# Patient Record
Sex: Male | Born: 1965 | Race: White | Hispanic: No | Marital: Married | Smoking: Never smoker
Health system: Southern US, Community
[De-identification: ages and names within clinical notes are randomized; demographics above are authoritative.]

## PROBLEM LIST (undated history)

## (undated) DIAGNOSIS — N200 Calculus of kidney: Secondary | ICD-10-CM

## (undated) DIAGNOSIS — G4730 Sleep apnea, unspecified: Secondary | ICD-10-CM

## (undated) DIAGNOSIS — K219 Gastro-esophageal reflux disease without esophagitis: Secondary | ICD-10-CM

## (undated) DIAGNOSIS — I10 Essential (primary) hypertension: Secondary | ICD-10-CM

## (undated) HISTORY — DX: Gastro-esophageal reflux disease without esophagitis: K21.9

## (undated) HISTORY — PX: ROTATOR CUFF REPAIR: SHX139

## (undated) HISTORY — DX: Calculus of kidney: N20.0

## (undated) HISTORY — DX: Essential (primary) hypertension: I10

## (undated) HISTORY — PX: OTHER SURGICAL HISTORY: SHX169

## (undated) HISTORY — DX: Sleep apnea, unspecified: G47.30

---

## 2001-09-17 ENCOUNTER — Emergency Department (HOSPITAL_COMMUNITY): Admission: EM | Admit: 2001-09-17 | Discharge: 2001-09-17 | Payer: Self-pay

## 2007-07-25 ENCOUNTER — Ambulatory Visit: Payer: Self-pay | Admitting: Cardiology

## 2007-07-27 ENCOUNTER — Ambulatory Visit: Payer: Self-pay | Admitting: Cardiology

## 2007-07-27 ENCOUNTER — Observation Stay (HOSPITAL_COMMUNITY): Admission: EM | Admit: 2007-07-27 | Discharge: 2007-07-28 | Payer: Self-pay | Admitting: General Surgery

## 2008-05-14 IMAGING — CR DG CHEST 1V PORT
1 series · 1 of 1 positions shown · non-contrast
Comparison: None.

CLINICAL DATA: Chest pain.  
 PORTABLE CHEST ? 1 VIEW ? 07/27/07:

[view not recorded]
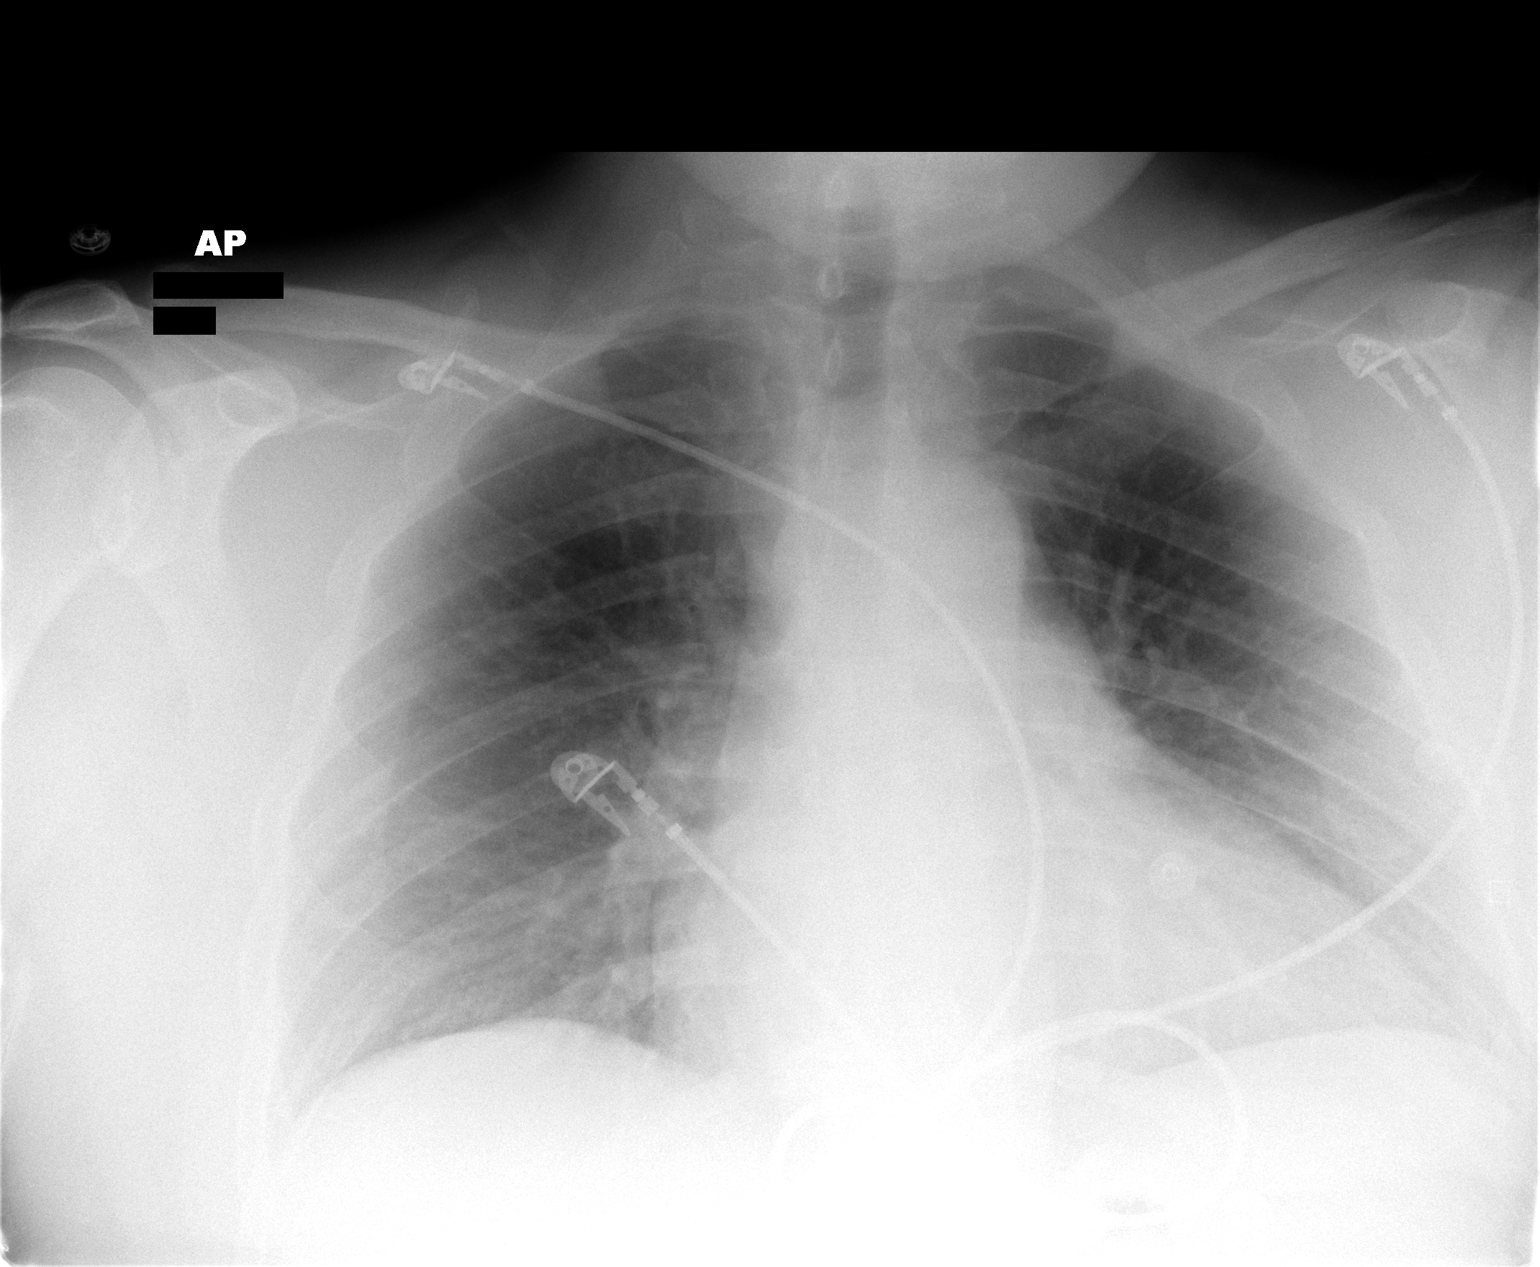

[1 of 1 positions shown; findings below may reference images not displayed]

FINDINGS: Trachea is midline.  Heart size is at the upper limits of normal and accentuated by technique.  Lungs are clear.
IMPRESSION: No acute findings.

## 2011-03-03 NOTE — Cardiovascular Report (Signed)
NAMEKAYMON, DENOMME                 ACCOUNT NO.:  [id number]   MEDICAL RECORD NO.:  [id number]          PATIENT TYPE:  OBV   LOCATION:  4743                         FACILITY:  [facility]   PHYSICIAN:  Veverly Fells. Excell Seltzer, MD  DATE OF BIRTH:  12/15/1965   DATE OF PROCEDURE:  07/28/2007  DATE OF DISCHARGE:                            CARDIAC CATHETERIZATION   PROCEDURE:  Left heart catheterization, selective coronary angiography,  left ventricular angiography, and StarClose of the right femoral artery.   INDICATIONS:  Mr. Carbajal is a 45 year old gentleman with multiple cardiac  risk factors including hypertension and obesity.  He presented with 1  week of chest pain and was referred for cardiac catheterization for  definitive evaluation.   Risks and indications of the procedure were explained to the patient.  Informed consent was obtained.  The right groin was prepped, draped,  anesthetized with 1% lidocaine using the modified Seldinger technique.  A 6 French sheath was placed in the right femoral artery.  Standard 6  French Judkins catheters were used to image the coronary arteries.  Following selective coronary angiography, an angled pigtail catheter was  placed in the left ventricle where pressures were recorded.  Left  ventriculogram was performed.  A pullback across the aortic valve was  done.  At the completion of procedure, a StarClose device was used to  seal the femoral arteriotomy.   FINDINGS:  Aortic pressure 124/84 with mean of 104, left ventricular  pressure 120/13.   The left mainstem is angiographically normal.  It bifurcates into the  LAD and left circumflex.   The LAD is a large-caliber vessel that courses down and reaches the LV  apex.  It supplies 2 medium-sized diagonal branches in the territory of  the second and third diagonal.  The proximal mid and distal aspects of  the LAD have no significant angiographic stenosis.   The left circumflex is a large-caliber vessel.   There is a smaller  medium branch present.  The circumflex supplies a large first OM that is  angiographically normal.  Beyond the first OM, the AV groove circumflex  courses down and supplies 2 small posterolateral branches.  There is no  significant angiographic stenosis in the left circumflex system.   The right coronary artery is of large caliber.  It is a dominant vessel.  It supplies a right PDA and a small right posterolateral branch.  There  are two RV marginal branches that arise from the mid aspect of the right  coronary artery.  There is no significant angiographic stenosis  throughout the right coronary artery.   Left ventricular function assessed by 30 degree RAO ventriculography is  normal.  LVEF is estimated at 65%.  There is no mitral regurgitation.   ASSESSMENT:  1. Normal coronary arteries.  2. Normal left ventricular systolic function.  3. Normal left ventricular diastolic filling pressure.   DISCUSSION:  Suspect noncardiac chest pain.  Mr. Maberry symptoms have  now resolved.  Would recommend ongoing primary risk reduction efforts  and likely discharge home later today.      Veverly Fells.  Excell Seltzer, MD  Electronically Signed     MDC/MEDQ  D:  07/28/2007  T:  07/28/2007  Job:  [id number]

## 2011-03-03 NOTE — Discharge Summary (Signed)
NAMEMORSE, BRUEGGEMANN                 ACCOUNT NO.:  [id number]   MEDICAL RECORD NO.:  [id number]          PATIENT TYPE:  OBV   LOCATION:  2855                         FACILITY:  [facility]   PHYSICIAN:  Veverly Fells. Excell Seltzer, MD  DATE OF BIRTH:  1966/03/26   DATE OF ADMISSION:  07/27/2007  DATE OF DISCHARGE:  07/28/2007                               DISCHARGE SUMMARY   PRIMARY CARDIOLOGIST:  Maisie Fus C. Wall, MD, [facility].   PRIMARY CARE PHYSICIAN:  Evelena Peat, M.D.   DISCHARGE DIAGNOSIS:  Chest pain.   SECONDARY DIAGNOSES:  1. Hypertension.  2. Obesity.  3. History of right wrist surgery.   ALLERGIES:  NO KNOWN DRUG ALLERGIES.   PROCEDURE:  Left heart cardiac catheterization.   HISTORY OF PRESENT ILLNESS:  A 45 year old Caucasian male with a five-  week history of intermittent chest pressure, worst with inhalation and  improved with BC powders.  He had recurrent symptoms on Saturday,  July 23, 2007, presented to the [facility] ER where he ruled out and  underwent exercise treadmill testing on Monday, July 25, 2007, which  was reportedly normal.  He returned to work and noted weakness and  orthostatic dizziness as well as recurrent chest pressure and  lightheadedness on July 27, 2007.  He therefore presented to the  [facility] ED where his ECG showed no acute changes  and cardiac markers were negative.  He was admitted for evaluation with  cardiac catheterization.   HOSPITAL COURSE:  Patient ruled out for MI and underwent left heart  cardiac catheterization this morning, July 28, 2007, revealing normal  coronary arteries with an EF of 65%.  Presumably his chest pain is  noncardiac and he is being discharged home this afternoon in  satisfactory condition.   DISCHARGE LABORATORY DATA:  Hemoglobin 13.6, hematocrit 39.7, wbc 9,  platelets 237, MCV 83.9.  Sodium 137, potassium 3.5, chloride 104, CO2  26, BUN 17, creatinine 1.09, glucose 123.  INR 1.1.  CK  139, MB 2.6,  troponin less than 0.01. Calcium 8.9.   DISPOSITION:  Patient is being discharged home today in good condition.   FOLLOW UP PLANS AND APPOINTMENTS:  He is asked to follow up with his  primary care Sharryn Belding, Dr. Caryl Never, in [place], [place], in  one to two weeks.   </DISCHARGE MEDICATIONS>  Micardis 40 mg daily.   OUTSTANDING LABORATORY STUDIES:  None.   DURATION OF DISCHARGE ENCOUNTER:  33 minutes including physician time.      Nicolasa Ducking, ANP      Veverly Fells. Excell Seltzer, MD  Electronically Signed    CB/MEDQ  D:  07/28/2007  T:  07/28/2007  Job:  [id number]   cc:   Evelena Peat, M.D.

## 2011-03-03 NOTE — H&P (Signed)
NAMEARRIS, MEYN                 ACCOUNT NO.:  [id number]   MEDICAL RECORD NO.:  [id number]          PATIENT TYPE:  OBV   LOCATION:  2855                         FACILITY:  [facility]   PHYSICIAN:  Maisie Fus C. Wall, MD, FACCDATE OF BIRTH:  Aug 23, 1966   DATE OF ADMISSION:  07/27/2007  DATE OF DISCHARGE:                              HISTORY & PHYSICAL   PRIMARY CARE PHYSICIAN:  Dr. Caryl Never at [facility].   PRIMARY CARDIOLOGIST:  Will be Dr. Juanito Doom.   CHIEF COMPLAINT:  Chest pain.   HISTORY OF PRESENT ILLNESS:  Mr. Vogel is a 45 year old male with no  previous history of coronary artery disease.  Five days ago, he  developed a sharp pain in the right side of his neck that went to the  posterior part of his neck and also was associated with headache.  At  the same time, he developed chest pressure at a 6/10.  Inhaling deeply  makes it better, but when he exhales, he feels it just as before.  He  took [facility] Powders with some relief but the pain continued intermittently.  He went to [facility] Emergency Room and was admitted.  His cardiac  enzymes were negative for MI and he was discharged Sunday, coming back  Monday for an exercise treadmill test.  He had no acute ischemic changes  on his EKG but did have some chest pressure with exertion.  Monday and  Tuesday he felt weak, and then today he was at work when he developed  orthostatic dizziness and then chest pain that he describes as pressure  as well as a sharp shooting pain which reached a 6/10.  This was  associated with numbness in his left arm and leg.  He came to the  emergency room.  He feels slightly light-headed now and is complaining  of the chest pressure at a 4 or 5/10.  The other sharp pain is gone.  The only medication he has received is baby aspirin.  He was given 1  sublingual nitroglycerin in the emergency room and his symptoms were  almost entirely relieved.  He denies shortness of breath, nausea,  vomiting or diaphoresis.   PAST MEDICAL HISTORY:  1. He was diagnosed with hypertension three days ago and took his      first dose of blood pressure medication today.  2. He is obese.  3. He had an exercise treadmill test at [facility] during which he      exercised for 9 minutes and reached 87% of his predictive maximum      heart rate.  The study was ended because of fatigue as well as      throat tightness and chest tightness, no significant EKG changes.   He denies other past medical history but has had surgery twice on his  right wrist.   ALLERGIES:  No known drug allergies.   CURRENT MEDICATIONS:  Micardis 40 mg per day (new).   SOCIAL HISTORY:  He lives in [place] with his wife and works as a  Armed forces training and education officer.  He has no history of  alcohol, tobacco or drug  abuse.   FAMILY HISTORY:  Both of his parents are alive and in their early to mid  21s and neither one has a history of coronary artery disease.  He has  two sisters, none of whom have coronary artery disease.  He did have one  uncle who had his first MI at age 6 and all of his grandparents had a  history of coronary artery disease, although none were diagnosed in  their 59s.   REVIEW OF SYSTEMS:  GENERAL:  He has had no recent fever or chills.  RESPIRATORY:  He has some chronic dyspnea on exertion which has not  changed recently.  He does not snore regularly, snoring only when he  falls asleep on his back and his wife states that she has never heard  him quit breathing.  MUSCULOSKELETAL:  He has occasional arthralgias.  He denies hematemesis, hemoptysis or melena.  A full 14-point review of  systems is otherwise negative.   PHYSICAL EXAMINATION:  VITAL SIGNS:  His temperature is 98.1, blood  pressure 128/84, pulse 74, respiratory rate 22, O2 saturation is 97% on  room air.  GENERAL:  He is a well-developed, obese white male in no acute distress.  HEENT:  Normal.  NECK:  There is no lymphadenopathy,  thyromegaly, bruit or JVD noted.  CV:  His heart is regular rate and rhythm with an S1 and S2 and no  significant murmur, rub or gallop is noted.  Distal pulses are intact in  all four extremities.  LUNGS:  Clear to auscultation bilaterally.  SKIN:  No rashes or lesions are noted.  ABDOMEN:  Soft and nontender with active bowel sounds and no  hepatosplenomegaly by deep palpation.  EXTREMITIES:  There is no cyanosis, clubbing or edema noted.  MUSCULOSKELETAL:  There is no joint deformity or fusions and no spine or  CVA tenderness.  NEURO:  He is alert and oriented.  Cranial nerves II-XII are grossly  intact.   Chest x-ray shows no acute disease.   EKG shows sinus rhythm, rate 68 with no acute ischemic changes and no  changes from EKG from [facility].   LABORATORY VALUES:  Pending, but point of care markers are negative x1.   IMPRESSION:  Chest pain.  Mr. Say is a 45 year old male with  substernal chest pain relieved with sublingual nitroglycerin.  He has  cardiac risk factors but a recent negative treadmill test.  The  situation was discussed with the patient and his wife.  If his cardiac  enzymes are negative, stress Myoview versus cardiac catheterization was  discussed.  The risks and benefits of the cardiac catheterization were  discussed including stroke, death, myocardial infarction, bleeding and  renal damage.  The patient and his wife prefer the most definitive  answer and, therefore, cardiac catheterization will be scheduled in the  morning.  Because his chest pain was relieved with sublingual  nitroglycerin, he will be started on intravenous nitroglycerin and  anticoagulated with heparin.  A lipid profile was performed at [facility] which showed a total cholesterol of 194, triglycerides 143, HDL  35, and LDL  130.  Dietary changes are recommended and a nutritionist consult has  been ordered.  If he is positive for coronary artery disease, a statin  will be added  to his medication regimen as well.  For better control of  his blood pressure, low-dose beta blocker has been added as well as  proton pump inhibitor.  Theodore Demark, PA-C      Jesse Sans. Daleen Squibb, MD, [facility]  Electronically Signed    RB/MEDQ  D:  07/27/2007  T:  07/28/2007  Job:  [id number]   cc:   Evelena Peat, M.D.

## 2011-05-20 ENCOUNTER — Encounter: Payer: Self-pay | Admitting: Podiatry

## 2011-07-30 LAB — POCT I-STAT CREATININE
Creatinine, Ser: 1
Operator id: 294521

## 2011-07-30 LAB — I-STAT 8, (EC8 V) (CONVERTED LAB)
Acid-Base Excess: 1
BUN: 20
Bicarbonate: 25.8 — ABNORMAL HIGH
Chloride: 105
Glucose, Bld: 98
HCT: 43
Hemoglobin: 14.6
Operator id: 294521
Potassium: 3.9
Sodium: 138
TCO2: 27
pCO2, Ven: 40.8 — ABNORMAL LOW
pH, Ven: 7.409 — ABNORMAL HIGH

## 2011-07-30 LAB — CK TOTAL AND CKMB (NOT AT ARMC)
CK, MB: 3.7
Relative Index: 1.9
Total CK: 193

## 2011-07-30 LAB — BASIC METABOLIC PANEL
BUN: 17
CO2: 26
GFR calc non Af Amer: >60
Glucose, Bld: 123 — ABNORMAL HIGH
Potassium: 3.5

## 2011-07-30 LAB — DIFFERENTIAL
Basophils Absolute: 0
Eosinophils Relative: 3
Lymphocytes Relative: 33
Lymphs Abs: 3
Neutro Abs: 5.2
Neutrophils Relative %: 58

## 2011-07-30 LAB — CARDIAC PANEL(CRET KIN+CKTOT+MB+TROPI)
CK, MB: 2.6
Relative Index: 1.9
Troponin I: <0.01

## 2011-07-30 LAB — POCT CARDIAC MARKERS
CKMB, poc: 3.1
Myoglobin, poc: 119
Operator id: 294521
Troponin i, poc: <0.05

## 2011-07-30 LAB — CBC
HCT: 39.7
Platelets: 237
WBC: 9

## 2012-01-13 DIAGNOSIS — E669 Obesity, unspecified: Secondary | ICD-10-CM | POA: Insufficient documentation

## 2012-02-08 DIAGNOSIS — G4733 Obstructive sleep apnea (adult) (pediatric): Secondary | ICD-10-CM | POA: Insufficient documentation

## 2014-01-12 DIAGNOSIS — N200 Calculus of kidney: Secondary | ICD-10-CM | POA: Insufficient documentation

## 2015-04-04 DIAGNOSIS — R519 Headache, unspecified: Secondary | ICD-10-CM | POA: Insufficient documentation

## 2015-04-24 DIAGNOSIS — R5381 Other malaise: Secondary | ICD-10-CM | POA: Insufficient documentation

## 2015-09-03 DIAGNOSIS — J209 Acute bronchitis, unspecified: Secondary | ICD-10-CM | POA: Insufficient documentation

## 2016-11-26 DIAGNOSIS — N509 Disorder of male genital organs, unspecified: Secondary | ICD-10-CM | POA: Insufficient documentation

## 2017-02-19 DIAGNOSIS — B029 Zoster without complications: Secondary | ICD-10-CM | POA: Insufficient documentation

## 2017-07-09 DIAGNOSIS — M545 Low back pain, unspecified: Secondary | ICD-10-CM | POA: Insufficient documentation

## 2019-10-05 DIAGNOSIS — R454 Irritability and anger: Secondary | ICD-10-CM | POA: Insufficient documentation

## 2020-01-26 ENCOUNTER — Encounter: Payer: Self-pay | Admitting: Internal Medicine

## 2020-02-21 ENCOUNTER — Ambulatory Visit (AMBULATORY_SURGERY_CENTER): Payer: Self-pay | Admitting: *Deleted

## 2020-02-21 ENCOUNTER — Other Ambulatory Visit: Payer: Self-pay

## 2020-02-21 VITALS — Temp 97.9°F | Ht 73.0 in | Wt 314.8 lb

## 2020-02-21 DIAGNOSIS — Z1211 Encounter for screening for malignant neoplasm of colon: Secondary | ICD-10-CM

## 2020-02-21 NOTE — Progress Notes (Signed)
covid test in [place] 03-01-20 at 910 am  Pt is aware that care partner will wait in the car during procedure; if they feel like they will be too hot or cold to wait in the car; they may wait in the 4 th floor lobby. Patient is aware to bring only one care partner. We want them to wear a mask (we do not have any that we can provide them), practice social distancing, and we will check their temperatures when they get here.  I did remind the patient that their care partner needs to stay in the parking lot the entire time and have a cell phone available, we will call them when the pt is ready for discharge. Patient will wear mask into building.   No egg or soy allergy  No home oxygen use   No medications for weight loss taken  emmi information given  Pt denies constipation issues   No trouble with anesthesia, difficulty with intubation or hx/fam hx of malignant hyperthermia per pt

## 2020-02-29 ENCOUNTER — Encounter: Payer: Self-pay | Admitting: Internal Medicine

## 2020-03-01 ENCOUNTER — Other Ambulatory Visit: Payer: Self-pay

## 2020-03-01 ENCOUNTER — Other Ambulatory Visit (HOSPITAL_COMMUNITY)
Admission: RE | Admit: 2020-03-01 | Discharge: 2020-03-01 | Disposition: A | Discharge status: Home / Self Care | Payer: BLUE CROSS/BLUE SHIELD | Source: Ambulatory Visit | Attending: Internal Medicine | Admitting: Internal Medicine

## 2020-03-01 DIAGNOSIS — Z20822 Contact with and (suspected) exposure to covid-19: Secondary | ICD-10-CM | POA: Insufficient documentation

## 2020-03-01 DIAGNOSIS — Z01812 Encounter for preprocedural laboratory examination: Secondary | ICD-10-CM | POA: Insufficient documentation

## 2020-03-01 LAB — SARS CORONAVIRUS 2 (TAT 6-24 HRS): SARS Coronavirus 2: NEGATIVE

## 2020-03-06 ENCOUNTER — Ambulatory Visit (AMBULATORY_SURGERY_CENTER): Payer: BLUE CROSS/BLUE SHIELD | Admitting: Internal Medicine

## 2020-03-06 ENCOUNTER — Encounter: Payer: Self-pay | Admitting: Internal Medicine

## 2020-03-06 ENCOUNTER — Other Ambulatory Visit: Payer: Self-pay

## 2020-03-06 VITALS — BP 130/78 | HR 72 | Temp 97.3°F | Resp 10 | Ht 73.0 in | Wt 314.0 lb

## 2020-03-06 DIAGNOSIS — Z1211 Encounter for screening for malignant neoplasm of colon: Secondary | ICD-10-CM

## 2020-03-06 MED ORDER — SODIUM CHLORIDE 0.9 % IV SOLN: 500.0000 mL | Freq: Once | INTRAVENOUS | Status: DC

## 2020-03-06 NOTE — Progress Notes (Signed)
Vitals-CW Temp-JB  Pt's states no medical or surgical changes since previsit or office visit. 

## 2020-03-06 NOTE — Patient Instructions (Addendum)
The colonoscopy was normal - no polyps or cancer seen.  Next routine colonoscopy or other screening test in 10 years - 2031  I appreciate the opportunity to care for you. Iva Boop, MD, FACG  YOU HAD AN ENDOSCOPIC PROCEDURE TODAY AT THE [facility]:   Refer to the procedure report that was given to you for any specific questions about what was found during the examination.  If the procedure report does not answer your questions, please call your gastroenterologist to clarify.  If you requested that your care partner not be given the details of your procedure findings, then the procedure report has been included in a sealed envelope for you to review at your convenience later.  YOU SHOULD EXPECT: Some feelings of bloating in the abdomen. Passage of more gas than usual.  Walking can help get rid of the air that was put into your GI tract during the procedure and reduce the bloating. If you had a lower endoscopy (such as a colonoscopy or flexible sigmoidoscopy) you may notice spotting of blood in your stool or on the toilet paper. If you underwent a bowel prep for your procedure, you may not have a normal bowel movement for a few days.  Please Note:  You might notice some irritation and congestion in your nose or some drainage.  This is from the oxygen used during your procedure.  There is no need for concern and it should clear up in a day or so.  SYMPTOMS TO REPORT IMMEDIATELY:   Following lower endoscopy (colonoscopy or flexible sigmoidoscopy):  Excessive amounts of blood in the stool  Significant tenderness or worsening of abdominal pains  Swelling of the abdomen that is new, acute  Fever of 100F or higher  For urgent or emergent issues, a gastroenterologist can be reached at any hour by calling [phone]. Do not use MyChart messaging for urgent concerns.    DIET:  We do recommend a small meal at first, but then you may proceed to your regular diet.  Drink plenty  of fluids but you should avoid alcoholic beverages for 24 hours.  ACTIVITY:  You should plan to take it easy for the rest of today and you should NOT DRIVE or use heavy machinery until tomorrow (because of the sedation medicines used during the test).    FOLLOW UP: Our staff will call the number listed on your records 48-72 hours following your procedure to check on you and address any questions or concerns that you may have regarding the information given to you following your procedure. If we do not reach you, we will leave a message.  We will attempt to reach you two times.  During this call, we will ask if you have developed any symptoms of COVID 19. If you develop any symptoms (ie: fever, flu-like symptoms, shortness of breath, cough etc.) before then, please call [phone].  If you test positive for Covid 19 in the 2 weeks post procedure, please call and report this information to [place].    If any biopsies were taken you will be contacted by phone or by letter within the next 1-3 weeks.  Please call us at [phone] if you have not heard about the biopsies in 3 weeks.    SIGNATURES/CONFIDENTIALITY: You and/or your care partner have signed paperwork which will be entered into your electronic medical record.  These signatures attest to the fact that that the information above on your After Visit Summary has been reviewed  and is understood.  Full responsibility of the confidentiality of this discharge information lies with you and/or your care-partner. 

## 2020-03-06 NOTE — Progress Notes (Signed)
A/ox3, pleased with MAC, report to RN 

## 2020-03-06 NOTE — Op Note (Signed)
[facility] Patient Name: Ian Salinas Procedure Date: 03/06/2020 11:33 AM MRN: [id number] Endoscopist: Iva Boop , MD Age: 54 Referring MD:  Date of Birth: 1966/01/08 Gender: Male Account #: [id number] Procedure:                Colonoscopy Indications:              Screening for colorectal malignant neoplasm Medicines:                Propofol per Anesthesia, Monitored Anesthesia Care Procedure:                Pre-Anesthesia Assessment:                           - Prior to the procedure, a History and Physical                            was performed, and patient medications and                            allergies were reviewed. The patient's tolerance of                            previous anesthesia was also reviewed. The risks                            and benefits of the procedure and the sedation                            options and risks were discussed with the patient.                            All questions were answered, and informed consent                            was obtained. Prior Anticoagulants: The patient has                            taken no previous anticoagulant or antiplatelet                            agents. ASA Grade Assessment: III - A patient with                            severe systemic disease. After reviewing the risks                            and benefits, the patient was deemed in                            satisfactory condition to undergo the procedure.                           After obtaining informed consent, the colonoscope  was passed under direct vision. Throughout the                            procedure, the patient's blood pressure, pulse, and                            oxygen saturations were monitored continuously. The                            Colonoscope was introduced through the anus and                            advanced to the the cecum, identified by   appendiceal orifice and ileocecal valve. The                            quality of the bowel preparation was good. The                            colonoscopy was performed without difficulty. The                            patient tolerated the procedure well. The bowel                            preparation used was Miralax via split dose                            instruction. Scope In: 11:38:50 AM Scope Out: 11:53:48 AM Scope Withdrawal Time: 0 hours 11 minutes 37 seconds  Total Procedure Duration: 0 hours 14 minutes 58 seconds  Findings:                 The perianal and digital rectal examinations were                            normal. Pertinent negatives include normal prostate                            (size, shape, and consistency).                           The colon (entire examined portion) appeared normal.                           No additional abnormalities were found on                            retroflexion. Complications:            No immediate complications. Estimated blood loss:                            None. Estimated Blood Loss:     Estimated blood loss: none. Recommendation:           - Repeat colonoscopy in 10 years for screening  purposes.                           - Patient has a contact number available for                            emergencies. The signs and symptoms of potential                            delayed complications were discussed with the                            patient. Return to normal activities tomorrow.                            Written discharge instructions were provided to the                            patient.                           - Resume previous diet.                           - Continue present medications. Iva Boop, MD 03/06/2020 11:59:28 AM This report has been signed electronically.

## 2020-03-08 ENCOUNTER — Telehealth: Payer: Self-pay | Admitting: *Deleted

## 2020-03-08 NOTE — Telephone Encounter (Signed)
Message left

## 2020-03-23 ENCOUNTER — Emergency Department (HOSPITAL_COMMUNITY)
Admission: EM | Admit: 2020-03-23 | Discharge: 2020-03-24 | Disposition: A | Discharge status: Home / Self Care | Payer: BLUE CROSS/BLUE SHIELD | Attending: Emergency Medicine | Admitting: Emergency Medicine

## 2020-03-23 ENCOUNTER — Other Ambulatory Visit: Payer: Self-pay

## 2020-03-23 ENCOUNTER — Encounter (HOSPITAL_COMMUNITY): Payer: Self-pay | Admitting: Emergency Medicine

## 2020-03-23 DIAGNOSIS — I10 Essential (primary) hypertension: Secondary | ICD-10-CM | POA: Insufficient documentation

## 2020-03-23 DIAGNOSIS — Z79899 Other long term (current) drug therapy: Secondary | ICD-10-CM | POA: Diagnosis not present

## 2020-03-23 DIAGNOSIS — R102 Pelvic and perineal pain: Secondary | ICD-10-CM | POA: Insufficient documentation

## 2020-03-23 DIAGNOSIS — R32 Unspecified urinary incontinence: Secondary | ICD-10-CM | POA: Insufficient documentation

## 2020-03-23 NOTE — ED Triage Notes (Signed)
Pt C/O body aches and chills that started yesterday. Pt reports pain in his testicles and unable to hold his urine.

## 2020-03-24 LAB — URINALYSIS, ROUTINE W REFLEX MICROSCOPIC
Bilirubin Urine: NEGATIVE
Glucose, UA: NEGATIVE mg/dL
Hgb urine dipstick: NEGATIVE
Ketones, ur: NEGATIVE mg/dL
Leukocytes,Ua: NEGATIVE
Nitrite: NEGATIVE
Protein, ur: NEGATIVE mg/dL
Specific Gravity, Urine: 1.025 (ref 1.005–1.030)
pH: 5 (ref 5.0–8.0)

## 2020-03-24 MED ORDER — CIPROFLOXACIN HCL 500 MG PO TABS
500.0000 mg | ORAL_TABLET | Freq: Two times a day (BID) | ORAL | 0 refills | Status: DC
Start: 2020-03-24 — End: 2021-02-10

## 2020-03-24 MED ORDER — KETOROLAC TROMETHAMINE 30 MG/ML IJ SOLN
30.0000 mg | Freq: Once | INTRAMUSCULAR | Status: AC
  Administered 2020-03-24: 30 mg via INTRAMUSCULAR
  Filled 2020-03-24: qty 1

## 2020-03-24 MED ORDER — IBUPROFEN 600 MG PO TABS: 600.0000 mg | ORAL_TABLET | Freq: Four times a day (QID) | ORAL | 0 refills | Status: AC | PRN

## 2020-03-24 NOTE — ED Provider Notes (Signed)
[facility] EMERGENCY DEPARTMENT Provider Note   CSN: [id number] Arrival date & time: 03/23/20  2247     History Chief Complaint  Patient presents with  . Generalized Body Aches    Ian Salinas is a 54 y.o. male.  HPI     This is a 54 year old male with a history of hypertension and prostatitis who presents with pelvic pain.  Patient reports that over the last 24 hours he has had increasing pelvic pain and pressure and urinary incontinence.  States that this is consistent with an episode of prostatitis he had approximately 10 years ago.  He has had chills without documented fevers.  Also reports body aches.  He has not taken anything for his symptoms.  Currently he rates his pain at 7 out of 10.  Denies frank dysuria or frequency.  Denies flank pain, nausea, vomiting, diarrhea.  Past Medical History:  Diagnosis Date  . GERD (gastroesophageal reflux disease)   . Hypertension   . Kidney stone   . Sleep apnea    no CPAP    There are no problems to display for this patient.   Past Surgical History:  Procedure Laterality Date  . left finger surgery    . right wrist surgey    . ROTATOR CUFF REPAIR Right    reattached bicep during surgery       Family History  Problem Relation Age of Onset  . Colon cancer Neg Hx   . Esophageal cancer Neg Hx   . Rectal cancer Neg Hx   . Stomach cancer Neg Hx     Social History   Tobacco Use  . Smoking status: Never Smoker  . Smokeless tobacco: Former Network engineer Use Topics  . Alcohol use: Not Currently  . Drug use: Never    Home Medications Prior to Admission medications   Medication Sig Start Date End Date Taking? Authorizing Provider  buPROPion (WELLBUTRIN XL) 150 MG 24 hr tablet Take 150 mg by mouth daily. 10/31/19   [provider]  ciprofloxacin (CIPRO) 500 MG tablet Take 1 tablet (500 mg total) by mouth 2 (two) times daily. 03/24/20   Abayomi Pattison, Barbette Hair, MD  cyclobenzaprine (FLEXERIL) 10 MG tablet Take by  mouth as needed. 09/23/12   [provider]  ibuprofen (ADVIL) 600 MG tablet Take 1 tablet (600 mg total) by mouth every 6 (six) hours as needed. 03/24/20   Vignesh Willert, Barbette Hair, MD  lisinopril (ZESTRIL) 10 MG tablet Take by mouth daily. 09/07/18   [provider]  omeprazole (PRILOSEC) 40 MG capsule Take by mouth daily. 09/07/18   [provider]    Allergies    Oxycodone-acetaminophen  Review of Systems   Review of Systems  Constitutional: Positive for chills. Negative for fever.  Respiratory: Negative for shortness of breath.   Cardiovascular: Negative for chest pain.  Gastrointestinal: Positive for abdominal pain. Negative for nausea and vomiting.  Genitourinary: Negative for difficulty urinating, discharge and dysuria.  All other systems reviewed and are negative.   Physical Exam Updated Vital Signs BP (!) 123/91 (BP Location: Right Arm)   Pulse (!) 104   Temp 98.3 F (36.8 C) (Oral)   Resp 17   SpO2 98%   Physical Exam Vitals and nursing note reviewed.  Constitutional:      Appearance: He is well-developed. He is obese. He is not ill-appearing.  HENT:     Head: Normocephalic and atraumatic.     Nose: Nose normal.  Mouth/Throat:     Mouth: Mucous membranes are moist.  Eyes:     Pupils: Pupils are equal, round, and reactive to light.  Cardiovascular:     Rate and Rhythm: Normal rate and regular rhythm.  Pulmonary:     Effort: Pulmonary effort is normal. No respiratory distress.  Abdominal:     Palpations: Abdomen is soft.     Tenderness: There is no abdominal tenderness. There is no right CVA tenderness, left CVA tenderness or rebound.  Genitourinary:    Comments: Normal circumcised penis, bilateral testicles with normal lie, no redness or overlying erythema, intact cremasteric reflex, no tenderness Musculoskeletal:     Cervical back: Neck supple.     Right lower leg: No edema.     Left lower leg: No edema.  Lymphadenopathy:      Cervical: No cervical adenopathy.  Skin:    General: Skin is warm and dry.  Neurological:     Mental Status: He is alert and oriented to person, place, and time.  Psychiatric:        Mood and Affect: Mood normal.     ED Results / Procedures / Treatments   Labs (all labs ordered are listed, but only abnormal results are displayed) Labs Reviewed  URINE CULTURE  URINALYSIS, ROUTINE W REFLEX MICROSCOPIC    EKG None  Radiology No results found.  Procedures Procedures (including critical care time)  Medications Ordered in ED Medications  ketorolac (TORADOL) 30 MG/ML injection 30 mg (30 mg Intramuscular Given 03/24/20 0105)    ED Course  I have reviewed the triage vital signs and the nursing notes.  Pertinent labs & imaging results that were available during my care of the patient were reviewed by me and considered in my medical decision making (see chart for details).    MDM Rules/Calculators/A&P                       Patient presents with pelvic pain and incontinence.  This is consistent with a prior episode of prostatitis.  He reports generalized chills and pelvic discomfort.  Testicle exam is very reassuring.  Doubt torsion.  Urinalysis obtained.  No evidence of UTI.  Denies penile discharge.  Prostate exam was deferred as this would likely not be clinically helpful.  We will treat empirically with Cipro for possible prostatitis.  Patient much improved with Toradol for anti-inflammatory effect.  Recommend naproxen twice daily and Cipro.  Will provide follow-up with urology.  His abdominal exam is otherwise benign and have low suspicion for intra-abdominal pathology such as appendicitis.  After history, exam, and medical workup I feel the patient has been appropriately medically screened and is safe for discharge home. Pertinent diagnoses were discussed with the patient. Patient was given return precautions.   Final Clinical Impression(s) / ED Diagnoses Final diagnoses:   Pelvic pain    Rx / DC Orders ED Discharge Orders         Ordered    ciprofloxacin (CIPRO) 500 MG tablet  2 times daily     03/24/20 0252    ibuprofen (ADVIL) 600 MG tablet  Every 6 hours PRN     03/24/20 0252           Shon Baton, MD 03/24/20 [phone]

## 2020-03-24 NOTE — ED Notes (Signed)
ED Provider at bedside. 

## 2020-03-24 NOTE — Discharge Instructions (Addendum)
You were seen today for pelvic pain.  This was consistent with your prior prostatitis.  You will be treated empirically.  Follow-up with urology.  If you develop fevers, worsening pain, any new or worsening symptoms you should be reevaluated.

## 2020-03-25 LAB — URINE CULTURE: Culture: NO GROWTH

## 2020-06-30 DIAGNOSIS — I160 Hypertensive urgency: Secondary | ICD-10-CM | POA: Insufficient documentation

## 2021-02-10 ENCOUNTER — Encounter: Payer: Self-pay | Admitting: Orthopedic Surgery

## 2021-02-10 ENCOUNTER — Ambulatory Visit (INDEPENDENT_AMBULATORY_CARE_PROVIDER_SITE_OTHER): Payer: No Typology Code available for payment source | Admitting: Orthopedic Surgery

## 2021-02-10 ENCOUNTER — Ambulatory Visit: Payer: No Typology Code available for payment source

## 2021-02-10 ENCOUNTER — Other Ambulatory Visit: Payer: Self-pay

## 2021-02-10 VITALS — BP 130/86 | HR 69 | Ht 73.0 in | Wt 310.0 lb

## 2021-02-10 DIAGNOSIS — M1811 Unilateral primary osteoarthritis of first carpometacarpal joint, right hand: Secondary | ICD-10-CM | POA: Diagnosis not present

## 2021-02-10 DIAGNOSIS — K219 Gastro-esophageal reflux disease without esophagitis: Secondary | ICD-10-CM | POA: Insufficient documentation

## 2021-02-10 DIAGNOSIS — M19131 Post-traumatic osteoarthritis, right wrist: Secondary | ICD-10-CM

## 2021-02-10 DIAGNOSIS — I10 Essential (primary) hypertension: Secondary | ICD-10-CM | POA: Insufficient documentation

## 2021-02-10 DIAGNOSIS — M25531 Pain in right wrist: Secondary | ICD-10-CM

## 2021-02-10 DIAGNOSIS — Z6841 Body Mass Index (BMI) 40.0 and over, adult: Secondary | ICD-10-CM

## 2021-02-10 NOTE — Patient Instructions (Addendum)
Contact your [facility] about seeing [facility] in [place].    Obesity, Adult  Obesity is the condition of having too much total body fat. Being overweight or obese means that your weight is greater than what is considered healthy for your body size. Obesity is determined by a measurement called BMI. BMI is an estimate of body fat and is calculated from height and weight. For adults, a BMI of 30 or higher is considered obese.  YOUR BMI IS 40 Obesity can lead to other health concerns and major illnesses, including:  Stroke.  Coronary artery disease (CAD).  Type 2 diabetes.  Some types of cancer, including cancers of the colon, breast, uterus, and gallbladder.  Osteoarthritis.  High blood pressure (hypertension).  High cholesterol.  Sleep apnea.  Gallbladder stones.  Infertility problems. What are the causes? Common causes of this condition include:  Eating daily meals that are high in calories, sugar, and fat.  Being born with genes that may make you more likely to become obese.  Having a medical condition that causes obesity, including: ? Hypothyroidism. ? Polycystic ovarian syndrome (PCOS). ? Binge-eating disorder. ? Cushing syndrome.  Taking certain medicines, such as steroids, antidepressants, and seizure medicines.  Not being physically active (sedentary lifestyle).  Not getting enough sleep.  Drinking high amounts of sugar-sweetened beverages, such as soft drinks. What increases the risk? The following factors may make you more likely to develop this condition:  Having a family history of obesity.  Being a woman of African American descent.  Being a man of Hispanic descent.  Living in an area with limited access to: ? [place] [facility], recreation centers, or sidewalks. ? Healthy food choices, such as grocery stores and farmers' markets. What are the signs or symptoms? The main sign of this condition is having too much body fat. How is this  diagnosed? This condition is diagnosed based on:  Your BMI. If you are an adult with a BMI of 30 or higher, you are considered obese.  Your waist circumference. This measures the distance around your waistline.  Your skinfold thickness. Your health care provider may gently pinch a fold of your skin and measure it. You may have other tests to check for underlying conditions. How is this treated? Treatment for this condition often includes changing your lifestyle. Treatment may include some or all of the following:  Dietary changes. This may include developing a healthy meal plan.  Regular physical activity. This may include activity that causes your heart to beat faster (aerobic exercise) and strength training. Work with your health care provider to design an exercise program that works for you.  Medicine to help you lose weight if you are unable to lose 1 pound a week after 6 weeks of healthy eating and more physical activity.  Treating conditions that cause the obesity (underlying conditions).  Surgery. Surgical options may include gastric banding and gastric bypass. Surgery may be done if: ? Other treatments have not helped to improve your condition. ? You have a BMI of 40 or higher. ? You have life-threatening health problems related to obesity. Follow these instructions at home: Eating and drinking  Follow recommendations from your health care provider about what you eat and drink. Your health care provider may advise you to: ? Limit fast food, sweets, and processed snack foods. ? Choose low-fat options, such as low-fat milk instead of whole milk. ? Eat 5 or more servings of fruits or vegetables every day. ? Eat  at home more often. This gives you more control over what you eat. ? Choose healthy foods when you eat out. ? Learn to read food labels. This will help you understand how much food is considered 1 serving. ? Learn what a healthy serving size is. ? Keep low-fat snacks  available. ? Limit sugary drinks, such as soda, fruit juice, sweetened iced tea, and flavored milk.  Drink enough water to keep your urine pale yellow.  Do not follow a fad diet. Fad diets can be unhealthy and even dangerous.   Physical activity  Exercise regularly, as told by your health care provider. ? Most adults should get up to 150 minutes of moderate-intensity exercise every week. ? Ask your health care provider what types of exercise are safe for you and how often you should exercise.  Warm up and stretch before being active.  Cool down and stretch after being active.  Rest between periods of activity. Lifestyle  Work with your health care provider and a dietitian to set a weight-loss goal that is healthy and reasonable for you.  Limit your screen time.  Find ways to reward yourself that do not involve food.  Do not drink alcohol if: ? Your health care provider tells you not to drink. ? You are pregnant, may be pregnant, or are planning to become pregnant.  If you drink alcohol: ? Limit how much you use to:  0-1 drink a day for women.  0-2 drinks a day for men. ? Be aware of how much alcohol is in your drink. In the [place] one drink equals one 12 oz bottle of beer (355 mL), one 5 oz glass of wine (148 mL), or one 1 oz glass of hard liquor (44 mL). General instructions  Keep a weight-loss journal to keep track of the food you eat and how much exercise you get.  Take over-the-counter and prescription medicines only as told by your health care provider.  Take vitamins and supplements only as told by your health care provider.  Consider joining a support group. Your health care provider may be able to recommend a support group.  Keep all follow-up visits as told by your health care provider. This is important. Contact a health care provider if:  You are unable to meet your weight loss goal after 6 weeks of dietary and lifestyle changes. Get help right away if you  are having:  Trouble breathing.  Suicidal thoughts or behaviors. Summary  Obesity is the condition of having too much total body fat.  Being overweight or obese means that your weight is greater than what is considered healthy for your body size.  Work with your health care provider and a dietitian to set a weight-loss goal that is healthy and reasonable for you.  Exercise regularly, as told by your health care provider. Ask your health care provider what types of exercise are safe for you and how often you should exercise. This information is not intended to replace advice given to you by your health care provider. Make sure you discuss any questions you have with your health care provider. Document Revised: 06/09/2018 Document Reviewed: 06/09/2018 Elsevier Patient Education  2021 [employer].

## 2021-02-10 NOTE — Progress Notes (Signed)
NEW PROBLEM//OFFICE VISIT  Summary assessment and plan:  OA RT THUMB AND T WRIST  REFER TO [facility] SURGERY    Chief Complaint  Patient presents with  . Hand Injury    Patient reports that he had an Injury since 2526.     55 year old male  Consultation requested  [employer].  Patient's chief complaint is pain in his right wrist.  In 1988 he was injured when a hydraulic machine malfunction hyperextending his wrist and fractured his scaphoid.  He was treated through the [place] with a appropriate prescription.  He says that he has pain in his wrist since 1989  Worse over the years becoming severe over the last year.  He was sent to an outside clinic here for consultation further management.  He also complains of loss of motion and difficulty reducing his spikes.   Review of Systems  HENT: Positive for hearing loss and tinnitus.   Musculoskeletal: Positive for joint pain.     Past Medical History:  Diagnosis Date  . GERD (gastroesophageal reflux disease)   . Hypertension   . Kidney stone   . Sleep apnea    no CPAP    Past Surgical History:  Procedure Laterality Date  . left finger surgery    . right wrist surgey    . ROTATOR CUFF REPAIR Right    reattached bicep during surgery    Family History  Problem Relation Age of Onset  . Colon cancer Neg Hx   . Esophageal cancer Neg Hx   . Rectal cancer Neg Hx   . Stomach cancer Neg Hx    Social History   Tobacco Use  . Smoking status: Never Smoker  . Smokeless tobacco: Former Clinical biochemist  . Vaping Use: Never used  Substance Use Topics  . Alcohol use: Not Currently  . Drug use: Never    Allergies  Allergen Reactions  . Oxycodone-Acetaminophen Other (See Comments)    "made me feel horrible"    Current Meds  Medication Sig  . buPROPion (WELLBUTRIN XL) 150 MG 24 hr tablet Take 150 mg by mouth daily.  . cyclobenzaprine (FLEXERIL) 10 MG tablet Take by mouth as needed.  Marland Kitchen ibuprofen (ADVIL) 600 MG tablet  Take 1 tablet (600 mg total) by mouth every 6 (six) hours as needed.  Marland Kitchen lisinopril (ZESTRIL) 10 MG tablet Take by mouth daily.  Marland Kitchen omeprazole (PRILOSEC) 40 MG capsule Take by mouth daily.    BP 130/86   Pulse 69   Ht 6\' 1"  (1.854 m)   Wt (!) 310 lb (140.6 kg)   BMI 40.90 kg/m    The patient meets the AMA guidelines for Morbid (severe) obesity with a BMI > 40.0 and I have recommended weight loss.   Physical Exam  General appearance: Well-developed well-nourished no gross deformities  Cardiovascular normal pulse and perfusion normal color without edema  Neurologically o sensation loss or deficits or pathologic reflexes  Psychological: Awake alert and oriented x3 mood and affect normal  Skin no lacerations or ulcerations no nodularity no palpable masses, no erythema or nodularity  Musculoskeletal:  Right wrist  There is an incision over the volar aspect of the wrist from on the scaphoid area.  He is tender in this area has decreased extension flexion compared to the left.  Grip strength is normal.  He has no atrophy.    MEDICAL DECISION MAKING  A.  Encounter Diagnoses  Name Primary?  . Pain in right wrist Yes  .  Body mass index 40.0-44.9, adult (HCC)   . Morbid obesity (HCC)     B. DATA ANALYSED:   IMAGING: Interpretation of images: INTERNAL  Orders: CONSULT REQUEST  Outside records reviewed: NO   C. MANAGEMENT   REC: HAND SURGERY CONSULT FOR OA WRIST S/P ORIF SCAPHOID RT WRIST   No orders of the defined types were placed in this encounter.     Fuller Canada, MD  02/10/2021 8:56 AM

## 2021-02-17 ENCOUNTER — Telehealth: Payer: Self-pay | Admitting: Orthopedic Surgery

## 2021-02-17 NOTE — Telephone Encounter (Signed)
Beverly from the [employer] [facility] in [place] called and they need to speak to the person doing the referral for this patient.  They need more notes.    If you could please call Meriam Sprague back at [phone]

## 2021-02-17 NOTE — Telephone Encounter (Signed)
Sent additional notes per her request.

## 2021-04-01 ENCOUNTER — Other Ambulatory Visit (HOSPITAL_BASED_OUTPATIENT_CLINIC_OR_DEPARTMENT_OTHER): Payer: Self-pay

## 2021-04-01 DIAGNOSIS — G4733 Obstructive sleep apnea (adult) (pediatric): Secondary | ICD-10-CM

## 2021-04-09 ENCOUNTER — Ambulatory Visit: Payer: No Typology Code available for payment source | Attending: Family Medicine | Admitting: Neurology

## 2021-04-09 ENCOUNTER — Other Ambulatory Visit: Payer: Self-pay

## 2021-04-09 DIAGNOSIS — G4733 Obstructive sleep apnea (adult) (pediatric): Secondary | ICD-10-CM | POA: Insufficient documentation

## 2021-04-10 NOTE — Procedures (Signed)
  [facility] Norrin Shreffler A. Gerilyn Pilgrim, MD     [email or web address]             NOCTURNAL POLYSOMNOGRAPHY   LOCATION: [facility]  Patient Name: Ian Salinas, Ian Salinas Date: 04/09/2021 Gender: Male D.O.B: 1965/10/28 Age (years): 37 Referring Provider: Aris Georgia NP Height (inches): 73 Interpreting Physician: Beryle Beams MD, ABSM Weight (lbs): 315 RPSGT: Peak, Robert BMI: 42 MRN: [id number] Neck Size: 18.50 CLINICAL INFORMATION Sleep Study Type: NPSG     Indication for sleep study: N/A     Epworth Sleepiness Score: 13     SLEEP STUDY TECHNIQUE As per the AASM Manual for the Scoring of Sleep and Associated Events v2.3 (April 2016) with a hypopnea requiring 4% desaturations.  The channels recorded and monitored were frontal, central and occipital EEG, electrooculogram (EOG), submentalis EMG (chin), nasal and oral airflow, thoracic and abdominal wall motion, anterior tibialis EMG, snore microphone, electrocardiogram, and pulse oximetry.  MEDICATIONS Medications self-administered by patient taken the night of the study : N/A  Current Outpatient Medications:    buPROPion (WELLBUTRIN XL) 150 MG 24 hr tablet, Take 150 mg by mouth daily., Disp: , Rfl:    cyclobenzaprine (FLEXERIL) 10 MG tablet, Take by mouth as needed., Disp: , Rfl:    ibuprofen (ADVIL) 600 MG tablet, Take 1 tablet (600 mg total) by mouth every 6 (six) hours as needed., Disp: 30 tablet, Rfl: 0   lisinopril (ZESTRIL) 10 MG tablet, Take by mouth daily., Disp: , Rfl:    omeprazole (PRILOSEC) 40 MG capsule, Take by mouth daily., Disp: , Rfl:       SLEEP ARCHITECTURE The study was initiated at 9:05:37 PM and ended at 5:00:42 AM.  Sleep onset time was 9.5 minutes and the sleep efficiency was 74.1%. The total sleep time was 352 minutes.  Stage REM latency was 163.5 minutes.  The patient spent 4.40% of the night in stage N1 sleep, 67.90% in stage N2 sleep, 9.94% in stage N3 and 17.8% in REM.  Alpha  intrusion was absent.  Supine sleep was 6.39%.  RESPIRATORY PARAMETERS The overall apnea/hypopnea index (AHI) was 6.6 per hour. There were 0 total apneas, including 0 obstructive, 0 central and 0 mixed apneas. There were 39 hypopneas and 18 RERAs.  The AHI during Stage REM sleep was 9.6 per hour.  AHI while supine was 16.0 per hour.  The mean oxygen saturation was 91.01%. The minimum SpO2 during sleep was 85.00%.  soft snoring was noted during this study.  CARDIAC DATA The 2 lead EKG demonstrated sinus rhythm. The mean heart rate was 66.02 beats per minute. Other EKG findings include: None.  LEG MOVEMENT DATA The total PLMS were 0 with a resulting PLMS index of 0.00. Associated arousal with leg movement index was 0.0.  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 The severity does not require positive airway pressure treatment.   Argie Ramming, MD Diplomate, American Board of Sleep Medicine.  ELECTRONICALLY SIGNED ON:  04/10/2021, 6:22 PM [facility] PH: [phone]   FX: [phone] ACCREDITED BY THE AMERICAN ACADEMY OF SLEEP MEDICINE

## 2022-11-06 DIAGNOSIS — Z0189 Encounter for other specified special examinations: Secondary | ICD-10-CM | POA: Diagnosis not present

## 2022-11-06 DIAGNOSIS — K219 Gastro-esophageal reflux disease without esophagitis: Secondary | ICD-10-CM | POA: Diagnosis not present

## 2022-11-06 DIAGNOSIS — R7303 Prediabetes: Secondary | ICD-10-CM | POA: Diagnosis not present

## 2022-11-06 DIAGNOSIS — I10 Essential (primary) hypertension: Secondary | ICD-10-CM | POA: Diagnosis not present

## 2022-11-06 DIAGNOSIS — E785 Hyperlipidemia, unspecified: Secondary | ICD-10-CM | POA: Diagnosis not present

## 2022-11-23 DIAGNOSIS — H9319 Tinnitus, unspecified ear: Secondary | ICD-10-CM | POA: Diagnosis not present

## 2022-11-23 DIAGNOSIS — Z461 Encounter for fitting and adjustment of hearing aid: Secondary | ICD-10-CM | POA: Diagnosis not present

## 2022-11-24 DIAGNOSIS — G4733 Obstructive sleep apnea (adult) (pediatric): Secondary | ICD-10-CM | POA: Diagnosis not present

## 2022-12-25 DIAGNOSIS — R933 Abnormal findings on diagnostic imaging of other parts of digestive tract: Secondary | ICD-10-CM | POA: Diagnosis not present

## 2022-12-25 DIAGNOSIS — Z9884 Bariatric surgery status: Secondary | ICD-10-CM | POA: Diagnosis not present

## 2022-12-25 DIAGNOSIS — Z79899 Other long term (current) drug therapy: Secondary | ICD-10-CM | POA: Diagnosis not present

## 2022-12-25 DIAGNOSIS — R1011 Right upper quadrant pain: Secondary | ICD-10-CM | POA: Diagnosis not present

## 2022-12-25 DIAGNOSIS — D135 Benign neoplasm of extrahepatic bile ducts: Secondary | ICD-10-CM | POA: Diagnosis not present

## 2022-12-25 DIAGNOSIS — K828 Other specified diseases of gallbladder: Secondary | ICD-10-CM | POA: Diagnosis not present

## 2022-12-25 DIAGNOSIS — D376 Neoplasm of uncertain behavior of liver, gallbladder and bile ducts: Secondary | ICD-10-CM | POA: Diagnosis not present

## 2022-12-25 DIAGNOSIS — K30 Functional dyspepsia: Secondary | ICD-10-CM | POA: Diagnosis not present

## 2022-12-25 DIAGNOSIS — K838 Other specified diseases of biliary tract: Secondary | ICD-10-CM | POA: Diagnosis not present

## 2022-12-25 DIAGNOSIS — R1084 Generalized abdominal pain: Secondary | ICD-10-CM | POA: Diagnosis not present

## 2022-12-25 DIAGNOSIS — Z791 Long term (current) use of non-steroidal anti-inflammatories (NSAID): Secondary | ICD-10-CM | POA: Diagnosis not present

## 2022-12-25 DIAGNOSIS — R001 Bradycardia, unspecified: Secondary | ICD-10-CM | POA: Diagnosis not present

## 2022-12-25 DIAGNOSIS — K824 Cholesterolosis of gallbladder: Secondary | ICD-10-CM | POA: Diagnosis not present

## 2022-12-25 DIAGNOSIS — I700 Atherosclerosis of aorta: Secondary | ICD-10-CM | POA: Diagnosis not present

## 2022-12-25 DIAGNOSIS — R1013 Epigastric pain: Secondary | ICD-10-CM | POA: Diagnosis not present

## 2022-12-25 DIAGNOSIS — R110 Nausea: Secondary | ICD-10-CM | POA: Diagnosis not present

## 2022-12-25 DIAGNOSIS — I10 Essential (primary) hypertension: Secondary | ICD-10-CM | POA: Diagnosis not present

## 2022-12-25 DIAGNOSIS — R079 Chest pain, unspecified: Secondary | ICD-10-CM | POA: Diagnosis not present

## 2022-12-25 DIAGNOSIS — Z5329 Procedure and treatment not carried out because of patient's decision for other reasons: Secondary | ICD-10-CM | POA: Diagnosis not present

## 2022-12-25 DIAGNOSIS — Z7982 Long term (current) use of aspirin: Secondary | ICD-10-CM | POA: Diagnosis not present

## 2022-12-25 DIAGNOSIS — K810 Acute cholecystitis: Secondary | ICD-10-CM | POA: Diagnosis not present

## 2022-12-26 DIAGNOSIS — K828 Other specified diseases of gallbladder: Secondary | ICD-10-CM | POA: Diagnosis not present

## 2022-12-26 DIAGNOSIS — K810 Acute cholecystitis: Secondary | ICD-10-CM | POA: Diagnosis not present

## 2022-12-27 DIAGNOSIS — R1011 Right upper quadrant pain: Secondary | ICD-10-CM | POA: Diagnosis not present

## 2022-12-27 DIAGNOSIS — Z79899 Other long term (current) drug therapy: Secondary | ICD-10-CM | POA: Diagnosis not present

## 2022-12-27 DIAGNOSIS — I129 Hypertensive chronic kidney disease with stage 1 through stage 4 chronic kidney disease, or unspecified chronic kidney disease: Secondary | ICD-10-CM | POA: Diagnosis not present

## 2022-12-27 DIAGNOSIS — Z885 Allergy status to narcotic agent status: Secondary | ICD-10-CM | POA: Diagnosis not present

## 2022-12-27 DIAGNOSIS — Z7982 Long term (current) use of aspirin: Secondary | ICD-10-CM | POA: Diagnosis not present

## 2022-12-27 DIAGNOSIS — K824 Cholesterolosis of gallbladder: Secondary | ICD-10-CM | POA: Diagnosis not present

## 2022-12-27 DIAGNOSIS — N189 Chronic kidney disease, unspecified: Secondary | ICD-10-CM | POA: Diagnosis not present

## 2022-12-27 DIAGNOSIS — K810 Acute cholecystitis: Secondary | ICD-10-CM | POA: Diagnosis not present

## 2023-01-05 DIAGNOSIS — K828 Other specified diseases of gallbladder: Secondary | ICD-10-CM | POA: Diagnosis not present

## 2023-01-05 DIAGNOSIS — I10 Essential (primary) hypertension: Secondary | ICD-10-CM | POA: Diagnosis not present

## 2023-01-05 DIAGNOSIS — K824 Cholesterolosis of gallbladder: Secondary | ICD-10-CM | POA: Diagnosis not present

## 2023-01-06 DIAGNOSIS — K8050 Calculus of bile duct without cholangitis or cholecystitis without obstruction: Secondary | ICD-10-CM | POA: Diagnosis not present

## 2023-01-06 DIAGNOSIS — K8020 Calculus of gallbladder without cholecystitis without obstruction: Secondary | ICD-10-CM | POA: Diagnosis not present

## 2023-01-06 DIAGNOSIS — K824 Cholesterolosis of gallbladder: Secondary | ICD-10-CM | POA: Diagnosis not present

## 2023-01-13 DIAGNOSIS — Z461 Encounter for fitting and adjustment of hearing aid: Secondary | ICD-10-CM | POA: Diagnosis not present

## 2023-01-13 DIAGNOSIS — H9313 Tinnitus, bilateral: Secondary | ICD-10-CM | POA: Diagnosis not present

## 2023-01-13 DIAGNOSIS — H903 Sensorineural hearing loss, bilateral: Secondary | ICD-10-CM | POA: Diagnosis not present

## 2023-03-04 DIAGNOSIS — H903 Sensorineural hearing loss, bilateral: Secondary | ICD-10-CM | POA: Diagnosis not present

## 2023-03-04 DIAGNOSIS — Z461 Encounter for fitting and adjustment of hearing aid: Secondary | ICD-10-CM | POA: Diagnosis not present

## 2023-05-07 DIAGNOSIS — Z6827 Body mass index (BMI) 27.0-27.9, adult: Secondary | ICD-10-CM | POA: Diagnosis not present

## 2023-05-07 DIAGNOSIS — Z9884 Bariatric surgery status: Secondary | ICD-10-CM | POA: Diagnosis not present

## 2023-05-07 DIAGNOSIS — E663 Overweight: Secondary | ICD-10-CM | POA: Diagnosis not present

## 2023-05-07 DIAGNOSIS — Z713 Dietary counseling and surveillance: Secondary | ICD-10-CM | POA: Diagnosis not present

## 2023-05-12 DIAGNOSIS — I10 Essential (primary) hypertension: Secondary | ICD-10-CM | POA: Diagnosis not present

## 2023-05-12 DIAGNOSIS — R7303 Prediabetes: Secondary | ICD-10-CM | POA: Diagnosis not present

## 2023-05-12 DIAGNOSIS — K219 Gastro-esophageal reflux disease without esophagitis: Secondary | ICD-10-CM | POA: Diagnosis not present

## 2023-05-12 DIAGNOSIS — E785 Hyperlipidemia, unspecified: Secondary | ICD-10-CM | POA: Diagnosis not present

## 2023-06-02 DIAGNOSIS — M19031 Primary osteoarthritis, right wrist: Secondary | ICD-10-CM | POA: Diagnosis not present

## 2024-06-07 ENCOUNTER — Ambulatory Visit (INDEPENDENT_AMBULATORY_CARE_PROVIDER_SITE_OTHER): Payer: Self-pay | Admitting: Urology

## 2024-06-07 ENCOUNTER — Encounter: Payer: Self-pay | Admitting: Urology

## 2024-06-07 VITALS — BP 143/88 | HR 60

## 2024-06-07 DIAGNOSIS — N486 Induration penis plastica: Secondary | ICD-10-CM

## 2024-06-07 MED ORDER — PENTOXIFYLLINE ER 400 MG PO TBCR: 400.0000 mg | EXTENDED_RELEASE_TABLET | Freq: Two times a day (BID) | ORAL | 2 refills | Status: DC

## 2024-06-07 NOTE — Progress Notes (Signed)
 06/07/2024 1:45 PM   Alm Ruth [facility] [birth year] [id number]  Referring provider: Jolee Elsie RAMAN, PA [address] [place],  [place] [address]  Penile curvature   HPI: Mr Leeds is a 58yo here for evaluation of penile curvature. Starting 3 months ago he notes progressive penile curvature with pain with erections. The curvature has been stable for 2 months. No trauma or injury. He can palpate a knot in his dorsal penile shaft.  No issues urinating. He has pain with intercourse.    PMH: Past Medical History:  Diagnosis Date   GERD (gastroesophageal reflux disease)    Hypertension    Kidney stone    Sleep apnea    no CPAP    Surgical History: Past Surgical History:  Procedure Laterality Date   left finger surgery     right wrist surgey     ROTATOR CUFF REPAIR Right    reattached bicep during surgery    Home Medications:  Allergies as of 06/07/2024       Reactions   Oxycodone-acetaminophen Other (See Comments)   made me feel horrible        Medication List        Accurate as of June 07, 2024  1:45 PM. If you have any questions, ask your nurse or doctor.          buPROPion 150 MG 24 hr tablet Commonly known as: WELLBUTRIN XL Take 150 mg by mouth daily.   cyclobenzaprine 10 MG tablet Commonly known as: FLEXERIL Take by mouth as needed.   ibuprofen  600 MG tablet Commonly known as: ADVIL  Take 1 tablet (600 mg total) by mouth every 6 (six) hours as needed.   lisinopril 10 MG tablet Commonly known as: ZESTRIL Take by mouth daily.   omeprazole 40 MG capsule Commonly known as: PRILOSEC Take by mouth daily.        Allergies:  Allergies  Allergen Reactions   Oxycodone-Acetaminophen Other (See Comments)    made me feel horrible    Family History: Family History  Problem Relation Age of Onset   Colon cancer Neg Hx    Esophageal cancer Neg Hx    Rectal cancer Neg Hx    Stomach cancer Neg Hx     Social History:  reports that he has  never smoked. He has quit using smokeless tobacco. He reports that he does not currently use alcohol. He reports that he does not use drugs.  ROS: All other review of systems were reviewed and are negative except what is noted above in HPI  Physical Exam: BP (!) 143/88   Pulse 60   Constitutional:  Alert and oriented, No acute distress. HEENT: [place] AT, moist mucus membranes.  Trachea midline, no masses. Cardiovascular: No clubbing, cyanosis, or edema. Respiratory: Normal respiratory effort, no increased work of breathing. GI: Abdomen is soft, nontender, nondistended, no abdominal masses GU: No CVA tenderness. Circumcised phallus. No masses/lesions on penis, testis, scrotum.  1.5cm palpable dorsal peyronies plaque Lymph: No cervical or inguinal lymphadenopathy. Skin: No rashes, bruises or suspicious lesions. Neurologic: Grossly intact, no focal deficits, moving all 4 extremities. Psychiatric: Normal mood and affect.  Laboratory Data: Lab Results  Component Value Date   WBC 9.0 07/28/2007   HGB 13.6 07/28/2007   HCT 39.7 07/28/2007   MCV 83.9 07/28/2007   PLT 237 07/28/2007    Lab Results  Component Value Date   CREATININE 1.09 07/27/2007    No results found for: PSA  No results  found for: TESTOSTERONE  No results found for: HGBA1C  Urinalysis    Component Value Date/Time   COLORURINE YELLOW 03/23/2020 2355   APPEARANCEUR CLEAR 03/23/2020 2355   LABSPEC 1.025 03/23/2020 2355   PHURINE 5.0 03/23/2020 2355   GLUCOSEU NEGATIVE 03/23/2020 2355   HGBUR NEGATIVE 03/23/2020 2355   BILIRUBINUR NEGATIVE 03/23/2020 2355   KETONESUR NEGATIVE 03/23/2020 2355   PROTEINUR NEGATIVE 03/23/2020 2355   NITRITE NEGATIVE 03/23/2020 2355   LEUKOCYTESUR NEGATIVE 03/23/2020 2355    No results found for: LABMICR, WBCUA, RBCUA, LABEPIT, MUCUS, BACTERIA  Pertinent Imaging:  No results found for this or any previous visit.  No results found for this or any previous  visit.  No results found for this or any previous visit.  No results found for this or any previous visit.  No results found for this or any previous visit.  No results found for this or any previous visit.  No results found for this or any previous visit.  No results found for this or any previous visit.   Assessment & Plan:    1. Peyronie disease (Primary) We discussed the management of peyronies disease including medical therapy, penile plication, verapamil therapy and xiaflex therapy. After discussed the options the patient elects for medical therapy since he is in the active phase of peyronies disease. I will see him back in 3 months. We will start trental  400mg  BID.   No follow-ups on file.  Belvie Clara, MD  [facility] 

## 2024-06-07 NOTE — Patient Instructions (Signed)
 Collagenase Injection (Dupuytren Contracture/Peyronie Disease) What is this medication? COLLAGENASE (kohl LAH jen ace) treats conditions caused by thickening of tissue in your body. It works by breaking down excess collagen in the tissue, which reduces stiffness and tightness. This medicine may be used for other purposes; ask your health care provider or pharmacist if you have questions. COMMON BRAND NAME(S): Xiaflex What should I tell my care team before I take this medication? They need to know if you have any of these conditions: Bleeding disorder An unusual or allergic reaction to collagenase, other medications, foods, dyes, or preservatives Pregnant or trying to get pregnant Breast-feeding How should I use this medication? This medication is injected into the affected area. It is given by your care team in a hospital or clinic setting. A special MedGuide will be given to you by the pharmacist with each prescription and refill. Be sure to read this information carefully each time. Talk to your care team about the use of this medication in children. Special care may be needed. Overdosage: If you think you have taken too much of this medicine contact a poison control center or emergency room at once. NOTE: This medicine is only for you. Do not share this medicine with others. What if I miss a dose? Keep appointments for follow-up doses. It is important not to miss your dose. Call your care team if you are unable to keep an appointment. What may interact with this medication? Aspirin and aspirin-like medications Certain medications that treat or prevent blood clots, such as warfarin, enoxaparin, dalteparin, apixaban, dabigatran, rivaroxaban This list may not describe all possible interactions. Give your health care provider a list of all the medicines, herbs, non-prescription drugs, or dietary supplements you use. Also tell them if you smoke, drink alcohol, or use illegal drugs. Some items may  interact with your medicine. What should I watch for while using this medication? Your condition will be monitored carefully while you are receiving this medication. If medication is for Dupuytren's Contracture, visit your care team 1 to 3 days after the injection. Until you visit your care team, do not flex or extend the fingers of your hand that was injected. Do not touch your finger that was injected. Elevate your hand until bedtime. Do not perform activity with the injected hand until you are told that it is ok. Follow any instructions about wearing a splint or performing finger exercises. Contact your care team as soon as possible if you get increasing redness or swelling in the hand, have numbness or tingling in the treated finger, or have trouble bending the finger after the swelling goes down. If medication is for Peyronie's disease, do not have sex between the first and second injections. Wait 4 weeks after the second injection and when there is no more pain or swelling in the penis to have sex. Avoid using vacuum erection devices during treatment with this medication. Try to avoid straining stomach muscles such as during bowel movements. Your care team will give you instructions on how to perform modeling activities at home. Contact your care team as soon as possible if you have severe pain or swelling in the penis, severe purple bruising and swelling of the penis, trouble passing urine, blood in urine, popping or cracking sound form the penis, or sudden loss of ability to maintain an erection. What side effects may I notice from receiving this medication? Side effects that you should report to your care team as soon as possible: Allergic reactions--skin rash,  itching, hives, swelling of the face, lips, tongue, or throat Feeling faint or lightheaded Skin infection--skin redness, swelling, warmth, or pain Severe back pain, chest pain, headache, trouble breathing after injection Snap or pop that  you feel or hear, severe pain, numbness, swelling, or bruising of or trouble moving in area where injected Side effects that usually do not require medical attention (report to your care team if they continue or are bothersome): Pain, redness, or irritation at injection site This list may not describe all possible side effects. Call your doctor for medical advice about side effects. You may report side effects to FDA at 1-800-FDA-1088. Where should I keep my medication? This medication is given in a hospital or clinic. It will not be stored at home. NOTE: This sheet is a summary. It may not cover all possible information. If you have questions about this medicine, talk to your doctor, pharmacist, or health care provider.  2024 Elsevier/Gold Standard (2021-09-19 00:00:00)

## 2024-09-11 ENCOUNTER — Ambulatory Visit: Admitting: Urology

## 2024-09-11 VITALS — BP 149/87 | HR 69

## 2024-09-11 DIAGNOSIS — N486 Induration penis plastica: Secondary | ICD-10-CM | POA: Diagnosis not present

## 2024-09-11 MED ORDER — PENTOXIFYLLINE ER 400 MG PO TBCR: 400.0000 mg | EXTENDED_RELEASE_TABLET | Freq: Two times a day (BID) | ORAL | 2 refills | Status: AC

## 2024-09-11 NOTE — Progress Notes (Unsigned)
 09/11/2024 3:04 PM   Ian Salinas Sep 17, 1966 [id number]  Referring provider: Jolee Elsie RAMAN, PA [address] [place],  [place] [address]  Followup peyronies disease    HPI: Ian Salinas is a 58yo here for followup for peyronies disease., No change in curvature   PMH: Past Medical History:  Diagnosis Date   GERD (gastroesophageal reflux disease)    Hypertension    Kidney stone    Sleep apnea    no CPAP    Surgical History: Past Surgical History:  Procedure Laterality Date   left finger surgery     right wrist surgey     ROTATOR CUFF REPAIR Right    reattached bicep during surgery    Home Medications:  Allergies as of 09/11/2024       Reactions   Oxycodone-acetaminophen Other (See Comments)   made me feel horrible        Medication List        Accurate as of September 11, 2024  3:04 PM. If you have any questions, ask your nurse or doctor.          buPROPion 150 MG 24 hr tablet Commonly known as: WELLBUTRIN XL Take 150 mg by mouth daily.   cyclobenzaprine 10 MG tablet Commonly known as: FLEXERIL Take by mouth as needed.   ibuprofen  600 MG tablet Commonly known as: ADVIL  Take 1 tablet (600 mg total) by mouth every 6 (six) hours as needed.   lisinopril 10 MG tablet Commonly known as: ZESTRIL Take by mouth daily.   omeprazole 40 MG capsule Commonly known as: PRILOSEC Take by mouth daily.   pentoxifylline  400 MG CR tablet Commonly known as: TRENTAL  Take 1 tablet (400 mg total) by mouth 2 (two) times daily.        Allergies:  Allergies  Allergen Reactions   Oxycodone-Acetaminophen Other (See Comments)    made me feel horrible    Family History: Family History  Problem Relation Age of Onset   Colon cancer Neg Hx    Esophageal cancer Neg Hx    Rectal cancer Neg Hx    Stomach cancer Neg Hx     Social History:  reports that he has never smoked. He has quit using smokeless tobacco. He reports that he does not currently use  alcohol. He reports that he does not use drugs.  ROS: All other review of systems were reviewed and are negative except what is noted above in HPI  Physical Exam: BP (!) 149/87   Pulse 69   Constitutional:  Alert and oriented, No acute distress. HEENT: [place] AT, moist mucus membranes.  Trachea midline, no masses. Cardiovascular: No clubbing, cyanosis, or edema. Respiratory: Normal respiratory effort, no increased work of breathing. GI: Abdomen is soft, nontender, nondistended, no abdominal masses GU: No CVA tenderness.  Lymph: No cervical or inguinal lymphadenopathy. Skin: No rashes, bruises or suspicious lesions. Neurologic: Grossly intact, no focal deficits, moving all 4 extremities. Psychiatric: Normal mood and affect.  Laboratory Data: Lab Results  Component Value Date   WBC 9.0 07/28/2007   HGB 13.6 07/28/2007   HCT 39.7 07/28/2007   MCV 83.9 07/28/2007   PLT 237 07/28/2007    Lab Results  Component Value Date   CREATININE 1.09 07/27/2007    No results found for: PSA  No results found for: TESTOSTERONE  No results found for: HGBA1C  Urinalysis    Component Value Date/Time   COLORURINE YELLOW 03/23/2020 2355   APPEARANCEUR CLEAR 03/23/2020 2355  LABSPEC 1.025 03/23/2020 2355   PHURINE 5.0 03/23/2020 2355   GLUCOSEU NEGATIVE 03/23/2020 2355   HGBUR NEGATIVE 03/23/2020 2355   BILIRUBINUR NEGATIVE 03/23/2020 2355   KETONESUR NEGATIVE 03/23/2020 2355   PROTEINUR NEGATIVE 03/23/2020 2355   NITRITE NEGATIVE 03/23/2020 2355   LEUKOCYTESUR NEGATIVE 03/23/2020 2355    No results found for: LABMICR, WBCUA, RBCUA, LABEPIT, MUCUS, BACTERIA  Pertinent Imaging: *** No results found for this or any previous visit.  No results found for this or any previous visit.  No results found for this or any previous visit.  No results found for this or any previous visit.  No results found for this or any previous visit.  No results found for this or  any previous visit.  No results found for this or any previous visit.  No results found for this or any previous visit.   Assessment & Plan:    1. Peyronie disease (Primary) We discussed the management of peyronies disease including medical therapy, penile plication, verapamil therapy and xiaflex therapy. After discussed the options the patient elects for *** medical therapy since he is in the active phase of peyronies disease. I will see him back in 3 months. We will trial trental  400mg  BID for 3 months    No follow-ups on file.  Belvie Clara, MD  [facility] [place]

## 2024-09-12 ENCOUNTER — Encounter: Payer: Self-pay | Admitting: Urology

## 2024-09-12 NOTE — Patient Instructions (Signed)
 Peyronie's Disease You will learn what this condition is and what treatment options are available. To view the content, go to this web address: [email or web address]  This video will expire on: 09/29/2025. If you need access to this video following this date, please reach out to the healthcare provider who assigned it to you. This information is not intended to replace advice given to you by your health care provider. Make sure you discuss any questions you have with your health care provider. Elsevier Patient Education  2024 [employer].

## 2025-03-14 ENCOUNTER — Ambulatory Visit: Admitting: Urology
